# Patient Record
Sex: Male | Born: 1975 | Race: White | Hispanic: No | Marital: Single | Smoking: Current every day smoker
Health system: Southern US, Community
[De-identification: ages and names within clinical notes are randomized; demographics above are authoritative.]

## PROBLEM LIST (undated history)

## (undated) DIAGNOSIS — I10 Essential (primary) hypertension: Secondary | ICD-10-CM

---

## 2008-03-26 ENCOUNTER — Emergency Department: Payer: Self-pay | Admitting: Emergency Medicine

## 2008-03-26 ENCOUNTER — Other Ambulatory Visit: Payer: Self-pay

## 2008-06-17 ENCOUNTER — Emergency Department: Payer: Self-pay | Admitting: Emergency Medicine

## 2008-06-17 ENCOUNTER — Other Ambulatory Visit: Payer: Self-pay

## 2017-08-09 ENCOUNTER — Encounter: Payer: Self-pay | Admitting: Emergency Medicine

## 2017-08-09 ENCOUNTER — Emergency Department
Admission: EM | Admit: 2017-08-09 | Discharge: 2017-08-09 | Disposition: A | Discharge status: Home / Self Care | Payer: Self-pay | Attending: Emergency Medicine | Admitting: Emergency Medicine

## 2017-08-09 ENCOUNTER — Emergency Department: Payer: Self-pay

## 2017-08-09 DIAGNOSIS — R61 Generalized hyperhidrosis: Secondary | ICD-10-CM | POA: Insufficient documentation

## 2017-08-09 DIAGNOSIS — R079 Chest pain, unspecified: Secondary | ICD-10-CM | POA: Insufficient documentation

## 2017-08-09 DIAGNOSIS — F17210 Nicotine dependence, cigarettes, uncomplicated: Secondary | ICD-10-CM | POA: Insufficient documentation

## 2017-08-09 DIAGNOSIS — R0602 Shortness of breath: Secondary | ICD-10-CM | POA: Insufficient documentation

## 2017-08-09 DIAGNOSIS — I10 Essential (primary) hypertension: Secondary | ICD-10-CM | POA: Insufficient documentation

## 2017-08-09 DIAGNOSIS — R202 Paresthesia of skin: Secondary | ICD-10-CM | POA: Insufficient documentation

## 2017-08-09 HISTORY — DX: Essential (primary) hypertension: I10

## 2017-08-09 LAB — BASIC METABOLIC PANEL
Anion gap: 10 (ref 5–15)
BUN: 14 mg/dL (ref 6–20)
CO2: 25 mmol/L (ref 22–32)
CREATININE: 0.89 mg/dL (ref 0.61–1.24)
Calcium: 9.8 mg/dL (ref 8.9–10.3)
Chloride: 101 mmol/L (ref 101–111)
GFR calc non Af Amer: >60 mL/min (ref >60)
Glucose, Bld: 112 mg/dL — ABNORMAL HIGH (ref 65–99)
POTASSIUM: 4 mmol/L (ref 3.5–5.1)
Sodium: 136 mmol/L (ref 135–145)

## 2017-08-09 LAB — CBC
HCT: 49.6 % (ref 40.0–52.0)
Hemoglobin: 17.2 g/dL (ref 13.0–18.0)
MCH: 30.6 pg (ref 26.0–34.0)
MCHC: 34.7 g/dL (ref 32.0–36.0)
MCV: 88.2 fL (ref 80.0–100.0)
PLATELETS: 232 10*3/uL (ref 150–440)
RBC: 5.62 MIL/uL (ref 4.40–5.90)
RDW: 12.8 % (ref 11.5–14.5)
WBC: 10.1 10*3/uL (ref 3.8–10.6)

## 2017-08-09 LAB — TROPONIN I: Troponin I: <0.03 ng/mL (ref <0.03)

## 2017-08-09 NOTE — ED Triage Notes (Signed)
Pt arrived via POV from home.  Pt reports chest tightness that started around 5pm. Pt states the tightness is in the left and mid chest.  7/10 discomfort.  Pt states he was lying on the couch when the tightness started.

## 2017-08-09 NOTE — ED Notes (Addendum)
Dr. Pershing Proud at bedside for pt evaluation and to discuss lab results.

## 2017-08-09 NOTE — ED Provider Notes (Signed)
[facility] Emergency Department Provider Note  ____________________________________________   First MD Initiated Contact with Patient 08/09/17 2041     (approximate)  I have reviewed the triage vital signs and the nursing notes.   HISTORY  Chief Complaint Chest Pain    HPI ROMULO OKRAY is a 41 y.o. male with a history of hypertension who is presenting to the emergency department with a one-hour episode of chest pain. He says that he was sitting on the couch watching television when he felt tightness in the center of his chest. The tightness did not radiate. It was associated with tingling in his bilateral hands as well as sweaty palms, shortness of breath. He denies any nausea or vomiting. He says that he has had these episodes on and off for about the past 5 years. He says that sometimes they are relieved with Tums or Zantac. He denies any heart disease in his family. Says he is a one pack per day smoker. Denies any worsening of symptoms with exertion. He has never seen a cardiologist or had a stress test. A symptomatic at this time. The patient says the symptoms lasted about one hour.   Past Medical History:  Diagnosis Date  . Hypertension     There are no active problems to display for this patient.   History reviewed. No pertinent surgical history.  Prior to Admission medications   Not on File    Allergies Patient has no known allergies.  No family history on file.  Social History Social History  Substance Use Topics  . Smoking status: Current Every Day Smoker    Packs/day: 1.00    Types: Cigarettes  . Smokeless tobacco: Never Used  . Alcohol use Yes    Review of Systems  Constitutional: No fever/chills Eyes: No visual changes. ENT: No sore throat. Cardiovascular:as above Respiratory: as above Gastrointestinal: No abdominal pain.  No nausea, no vomiting.  No diarrhea.  No constipation. Genitourinary: Negative for  dysuria. Musculoskeletal: Negative for back pain. Skin: Negative for rash. Neurological: Negative for headaches, focal weakness or numbness.   ____________________________________________   PHYSICAL EXAM:  VITAL SIGNS: ED Triage Vitals  Enc Vitals Group     BP 08/09/17 1821 (!) 177/99     Pulse Rate 08/09/17 1821 82     Resp 08/09/17 1821 20     Temp 08/09/17 1821 98.7 F (37.1 C)     Temp Source 08/09/17 1821 Oral     SpO2 08/09/17 1821 98 %     Weight 08/09/17 1820 260 lb (117.9 kg)     Height 08/09/17 1820 6' (1.829 m)     Head Circumference --      Peak Flow --      Pain Score 08/09/17 1819 7     Pain Loc --      Pain Edu? --      Excl. in GC? --     Constitutional: Alert and oriented. Well appearing and in no acute distress. Eyes: Conjunctivae are normal.  Head: Atraumatic. Nose: No congestion/rhinnorhea. Mouth/Throat: Mucous membranes are moist.  Neck: No stridor.   Cardiovascular: Normal rate, regular rhythm. Grossly normal heart sounds.  Good peripheral circulation with equal and bilateral radial as well as dorsalis pedis pulses. Respiratory: Normal respiratory effort.  No retractions. Lungs CTAB. Gastrointestinal: Soft and nontender. No distention.  Musculoskeletal: No lower extremity tenderness nor edema.  No joint effusions. Neurologic:  Normal speech and language. No gross focal neurologic deficits are  appreciated. Skin:  Skin is warm, dry and intact. No rash noted. Psychiatric: Mood and affect are normal. Speech and behavior are normal.  ____________________________________________   LABS (all labs ordered are listed, but only abnormal results are displayed)  Labs Reviewed  BASIC METABOLIC PANEL - Abnormal; Notable for the following:       Result Value   Glucose, Bld 112 (*)    All other components within normal limits  CBC  TROPONIN I  TROPONIN I   ____________________________________________  EKG  ED ECG REPORT I, Arelia Longest, the  attending physician, personally viewed and interpreted this ECG.   Date: 08/09/2017  EKG Time: 1820  Rate: 76  Rhythm: normal sinus rhythm  Axis: normal  Intervals:none  ST&T Change: no ST segment elevation or depression. No abnormal T-wave inversion.  ____________________________________________  RADIOLOGY  no acute finding on the chest x-ray ____________________________________________   PROCEDURES  Procedure(s) performed: None  Procedures  Critical Care performed: No  ____________________________________________   INITIAL IMPRESSION / ASSESSMENT AND PLAN / ED COURSE  Pertinent labs & imaging results that were available during my care of the patient were reviewed by me and considered in my medical decision making (see chart for details).  Differential diagnosis includes, but is not limited to, ACS, aortic dissection, pulmonary embolism, cardiac tamponade, pneumothorax, pneumonia, pericarditis/myocarditis, GI-related causes including esophagitis/gastritis, and musculoskeletal chest wall pain.    PERC negative  As part of my medical decision making, I reviewed the following data within the electronic MEDICAL RECORD NUMBER Old chart reviewed    Clinical Course as of Aug 09 2229  Wed Aug 09, 2017  2137 heart score of 2  [DS]    Clinical Course User Index [DS] Myrna Blazer, MD   ----------------------------------------- 10:30 PM on 08/09/2017 -----------------------------------------  Patient remains asymptomatic at this time. 2 negative troponins. Patient we discharged home. We discussed cardiology follow-up. The patient understands that he must call tomorrow morning for a 48-hour follow-up appointment. He understands return to the emergency department for any worsening or concerning symptoms.5 years of similar symptoms with very reassuring workup this evening. I believe the patient is appropriate for outpatient  follow-up.  ____________________________________________   FINAL CLINICAL IMPRESSION(S) / ED DIAGNOSES  chest pain.    NEW MEDICATIONS STARTED DURING THIS VISIT:  New Prescriptions   No medications on file     Note:  This document was prepared using Dragon voice recognition software and may include unintentional dictation errors.     Myrna Blazer, MD 08/09/17 2231

## 2017-08-13 DIAGNOSIS — F17200 Nicotine dependence, unspecified, uncomplicated: Secondary | ICD-10-CM | POA: Insufficient documentation

## 2017-08-13 DIAGNOSIS — R079 Chest pain, unspecified: Secondary | ICD-10-CM | POA: Insufficient documentation

## 2017-08-13 NOTE — Progress Notes (Signed)
Cardiology Office Note  Date:  08/14/2017   ID:  Randy Horne, DOB November 29, 1975, MRN [id number]  PCP:  Patient, No Pcp Per   Chief Complaint  Patient presents with  . other    ED follow up. Patient states SOB, chest pain and hand tingling has went away.    HPI:  Randy Horne is a 41 y.o. male with a history of  hypertension  Obstructive sleep apnea on CPAP Obesity Prior episodes of chest pain, shortness of breath presenting by referral from the emergency room for evaluation of chest pain symptoms  He reports that he was sitting on the couch watching television when he felt tightness in the center of his chest.  The tightness did not radiate E developed tingling in his hands bilaterally sweaty palms, shortness of breath.  episodes on and off for about the past 5 years.  He went to the emergency room, EKG and revealing, cardiac enzymes negative, chest x-ray normal Hospital records reviewed with the patient in detail  Sometimes symptoms are relieved with Tums or Zantac.   he is a one pack per day smoker. Typically very active at baseline, works in [employer]   dad with no significant cardiac disease Mom Possiblywith CAD, we have had a stent  EKG personally reviewed by myself on todays visit Shows normal sinus rhythm rate 76 bpm no significant ST or T-wave changes   PMH:   has a past medical history of Hypertension.  obstructive sleep apnea, obesity  PSH:   History reviewed. No pertinent surgical history.  Current Outpatient Prescriptions  Medication Sig Dispense Refill  . lisinopril (PRINIVIL,ZESTRIL) 30 MG tablet Take 30 mg by mouth daily.  0  . verapamil (CALAN-SR) 120 MG CR tablet Take 1 tablet (120 mg total) by mouth at bedtime. 90 tablet 3   No current facility-administered medications for this visit.      Allergies:   Patient has no known allergies.   Social History:  The patient  reports that he has been smoking Cigarettes.  He has been smoking  about 1.00 pack per day. He has never used smokeless tobacco. He reports that he drinks alcohol.   Family History:   family history is not on file.    Review of Systems: Review of Systems  Constitutional: Negative.   Respiratory: Negative.   Cardiovascular: Negative.   Gastrointestinal: Negative.   Musculoskeletal: Negative.   Neurological: Negative.   Psychiatric/Behavioral: Negative.   All other systems reviewed and are negative.    PHYSICAL EXAM: VS:  BP (!) 160/85 (BP Location: Left Arm, Patient Position: Sitting, Cuff Size: Normal)   Pulse 87   Ht 6' (1.829 m)   Wt 259 lb 4 oz (117.6 kg)   BMI 35.16 kg/m  , BMI Body mass index is 35.16 kg/m. GEN: Well nourished, well developed, in no acute distress  HEENT: normal  Neck: no JVD, carotid bruits, or masses Cardiac: RRR; no murmurs, rubs, or gallops,no edema  Respiratory:  clear to auscultation bilaterally, normal work of breathing GI: soft, nontender, nondistended, + BS MS: no deformity or atrophy  Skin: warm and dry, no rash Neuro:  Strength and sensation are intact Psych: euthymic mood, full affect    Recent Labs: 08/09/2017: BUN 14; Creatinine, Ser 0.89; Hemoglobin 17.2; Platelets 232; Potassium 4.0; Sodium 136    Lipid Panel No results found for: CHOL, HDL, LDLCALC, TRIG    Wt Readings from Last 3 Encounters:  08/14/17 259 lb 4 oz (117.6 kg)  08/09/17 260 lb (117.9 kg)       ASSESSMENT AND PLAN:  Chest pain, unspecified type - Plan: ECHOCARDIOGRAM STRESS TEST Atypical in nature, Risk factors include smoking There is obesity, uncertain family history, uncertain lipids Borderline glucose on recent lab work Discussed various strategies for chest pain workup including treadmill, stress echo He prefers stress echo, this will be scheduled at his convenience  Essential hypertension Unclear why he is on a short acting verapamil We will change verapamil to extended release 120 mg daily Lisinopril 30 mg  is not a usual dose but he reports this is his current milligrams Recommended he monitor blood pressure at home and call our office if blood pressure continues to run high as demonstrated today  Smoker We have encouraged him to continue to work on weaning his cigarettes and smoking cessation. He will continue to work on this and does not want any assistance with chantix.   SOB (shortness of breath) - Plan: ECHOCARDIOGRAM STRESS TEST Stress test ordered as above Unable to exclude deconditioning and history of smoking as a contributor to his symptoms  Screening for cholesterol level - Plan: Lipid panel Lipid panel has been ordered given symptoms of chest pain  OSA on CPAP He is compliant with his CPAP Stressed the importance  Obesity We have encouraged continued exercise, careful diet management in an effort to lose weight.   Total encounter time more than 60 minutes  Greater than 50% was spent in counseling and coordination of care with the patient  Disposition:   F/U  As needed   Orders Placed This Encounter  Procedures  . Lipid panel  . ECHOCARDIOGRAM STRESS TEST     Signed, Dossie Arbour, M.D., Ph.D. 08/14/2017  [facility] [place], [place] [phone]

## 2017-08-14 ENCOUNTER — Encounter: Payer: Self-pay | Admitting: Cardiovascular Disease

## 2017-08-14 ENCOUNTER — Ambulatory Visit (INDEPENDENT_AMBULATORY_CARE_PROVIDER_SITE_OTHER): Payer: Self-pay | Admitting: Cardiovascular Disease

## 2017-08-14 VITALS — BP 160/85 | HR 87 | Ht 72.0 in | Wt 259.2 lb

## 2017-08-14 DIAGNOSIS — Z13220 Encounter for screening for lipoid disorders: Secondary | ICD-10-CM

## 2017-08-14 DIAGNOSIS — Z9989 Dependence on other enabling machines and devices: Secondary | ICD-10-CM

## 2017-08-14 DIAGNOSIS — I10 Essential (primary) hypertension: Secondary | ICD-10-CM

## 2017-08-14 DIAGNOSIS — R079 Chest pain, unspecified: Secondary | ICD-10-CM

## 2017-08-14 DIAGNOSIS — F17200 Nicotine dependence, unspecified, uncomplicated: Secondary | ICD-10-CM

## 2017-08-14 DIAGNOSIS — R0602 Shortness of breath: Secondary | ICD-10-CM

## 2017-08-14 DIAGNOSIS — G4733 Obstructive sleep apnea (adult) (pediatric): Secondary | ICD-10-CM

## 2017-08-14 MED ORDER — VERAPAMIL HCL ER 120 MG PO TBCR: 120.0000 mg | EXTENDED_RELEASE_TABLET | Freq: Every day | ORAL | 3 refills | Status: AC

## 2017-08-14 NOTE — Patient Instructions (Addendum)
Medication Instructions:   Please start the verapamil 120 SR Stop the verapamil 80 (short acting) Stay on lisinopril  Labwork:  Lipid panel, fasting, at your convenience  Testing/Procedures:  We will order a stress echo for chest pain and SOB   Follow-Up: It was a pleasure seeing you in the office today. Please call us if you have new issues that need to be addressed before your next appt.  [phone]  Your physician wants you to follow-up in: as needed  If you need a refill on your cardiac medications before your next appointment, please call your pharmacy.

## 2017-08-24 ENCOUNTER — Telehealth: Payer: Self-pay | Admitting: *Deleted

## 2017-08-24 NOTE — Telephone Encounter (Signed)
No answer. Left message with reminder that stress echo tomorrow at 0930 and not caffeine for 24 hours prior to to wear sneakers.

## 2017-08-25 ENCOUNTER — Ambulatory Visit (INDEPENDENT_AMBULATORY_CARE_PROVIDER_SITE_OTHER): Payer: Self-pay

## 2017-08-25 DIAGNOSIS — R0602 Shortness of breath: Secondary | ICD-10-CM

## 2017-08-25 DIAGNOSIS — R079 Chest pain, unspecified: Secondary | ICD-10-CM

## 2017-08-25 LAB — ECHOCARDIOGRAM STRESS TEST
CHL CUP RESTING HR STRESS: 72 {beats}/min
CSEPED: 9 min
CSEPEDS: 53 s
CSEPHR: 90 %
Estimated workload: 11.7 METS
MPHR: 180 {beats}/min
Peak HR: 162 {beats}/min

## 2019-07-08 IMAGING — CR DG CHEST 2V
1 series · 2 of 2 positions shown · non-contrast
Comparison: None.

CLINICAL DATA: Chest tightness, onset around [DATE] at rest.

EXAM:
CHEST  2 VIEW

[Series 1: w chest pa · 0.14mm/px · 2 of 2 slices shown]
[im 1/2]
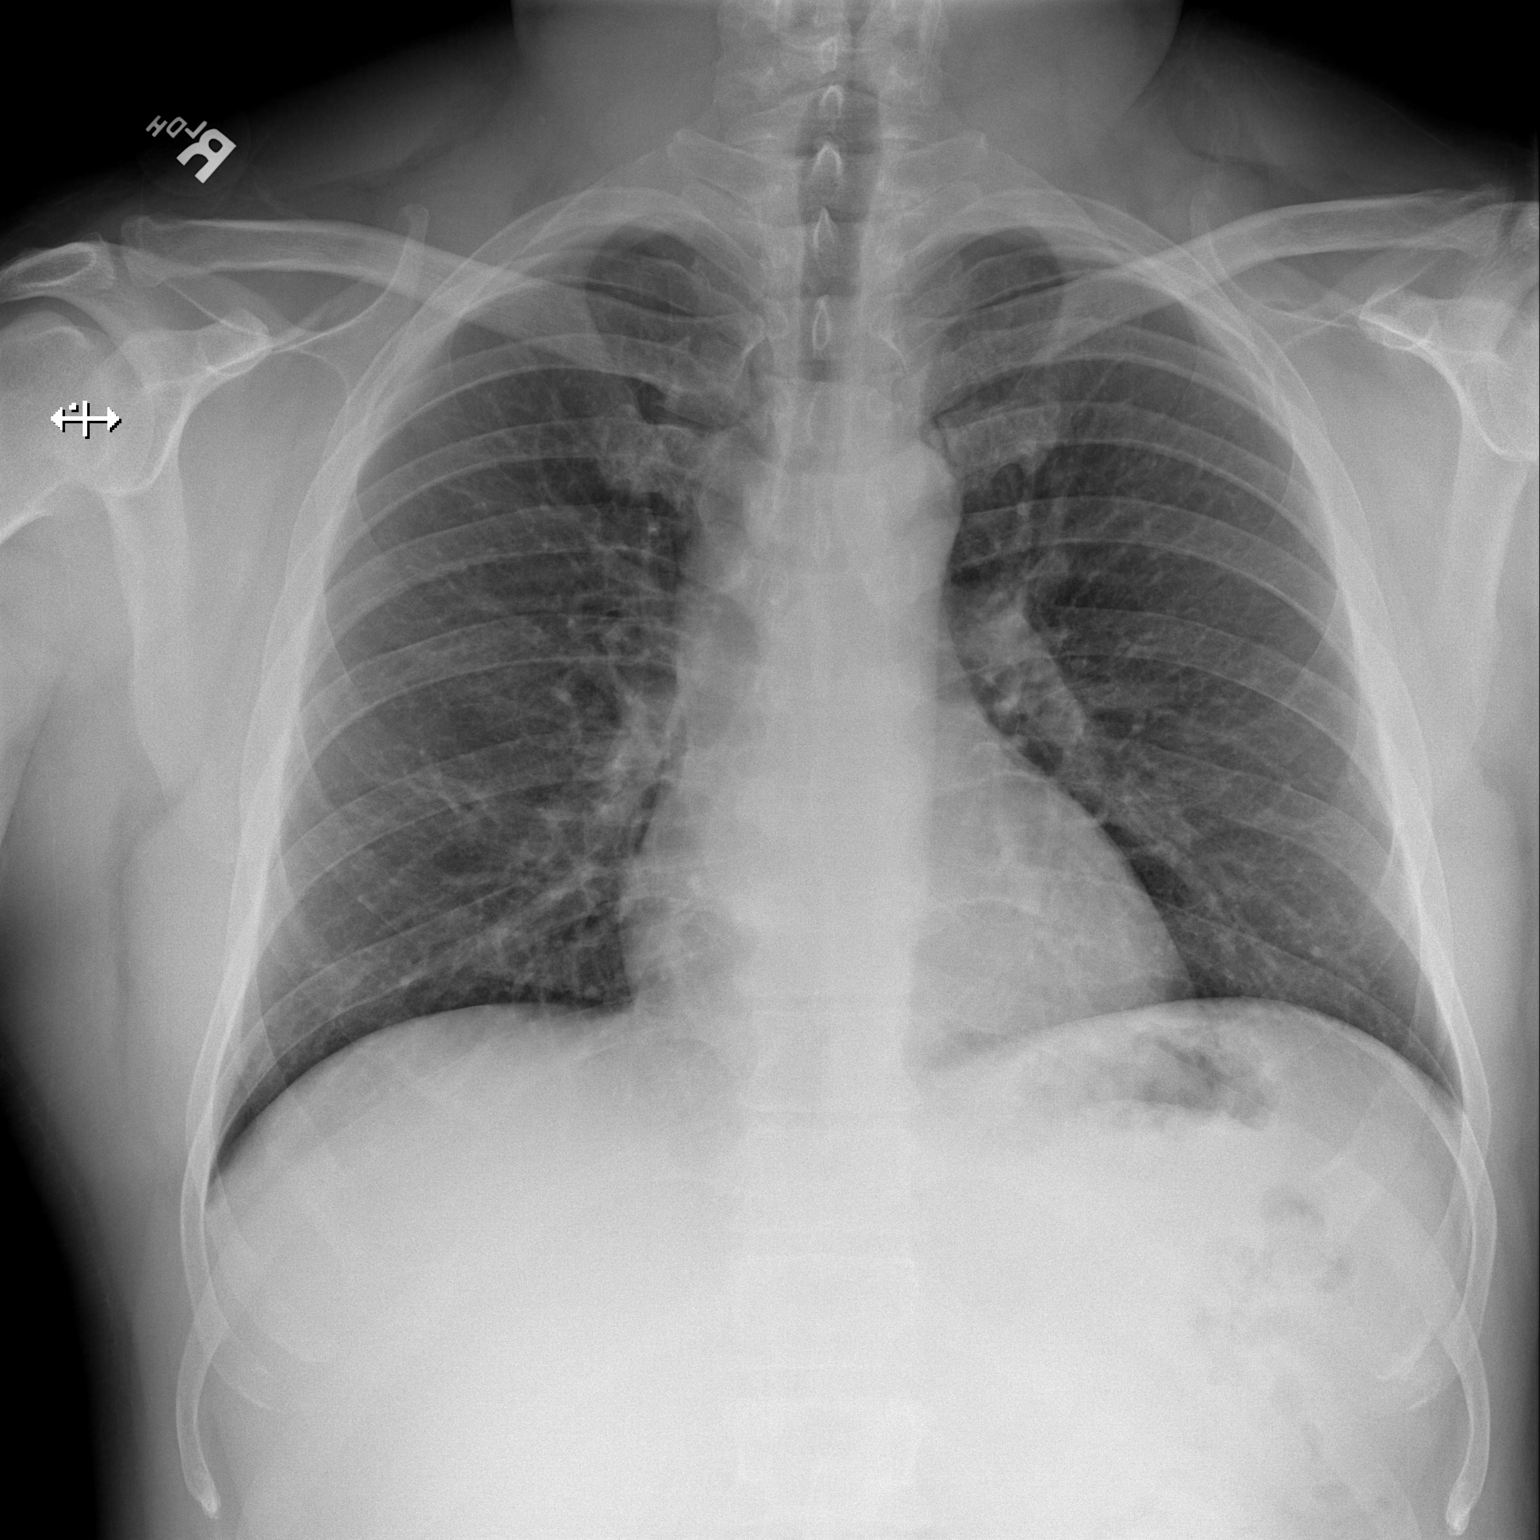
[im 2/2]
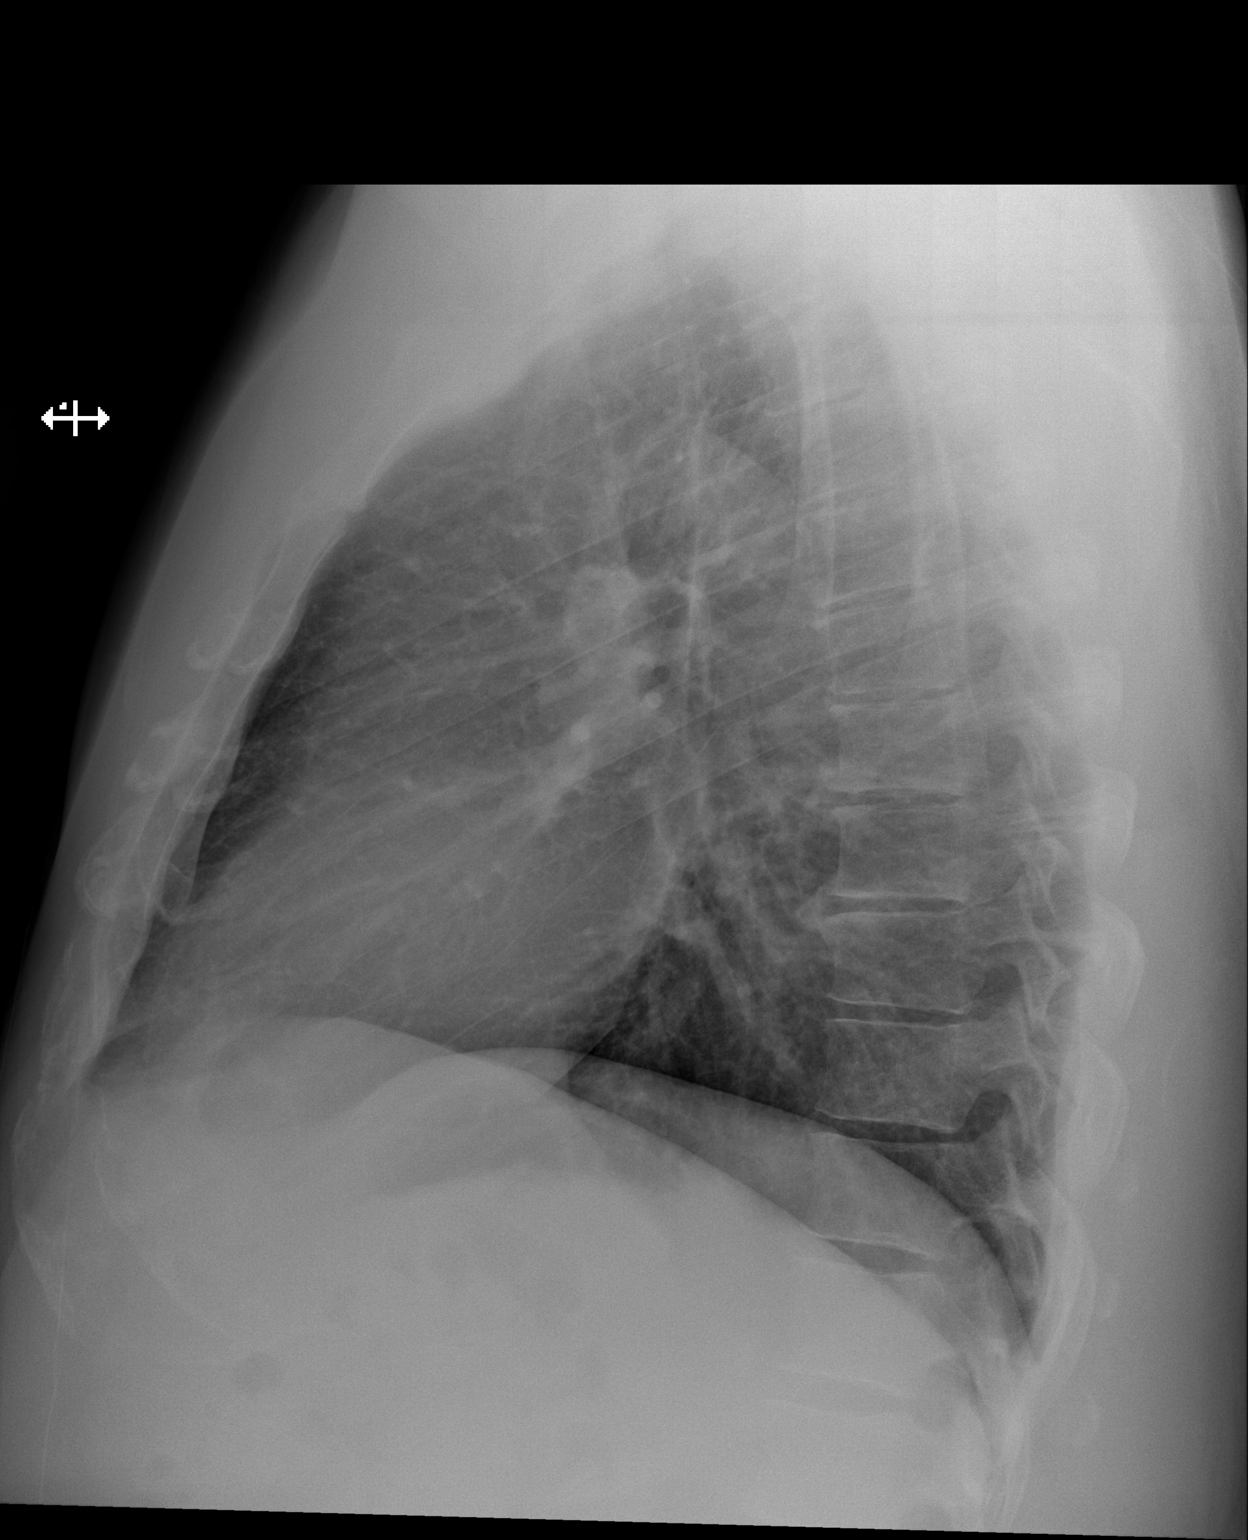

[2 of 2 positions shown; findings below may reference images not displayed]

FINDINGS: The lungs are clear. The pulmonary vasculature is normal. Heart size
is normal. Hilar and mediastinal contours are unremarkable. There is
no pleural effusion.
IMPRESSION: No active cardiopulmonary disease.
